# Patient Record
Sex: Female | Born: 2000 | Hispanic: Yes | Marital: Single | Smoking: Never smoker
Health system: Southern US, Community
[De-identification: ages and names within clinical notes are randomized; demographics above are authoritative.]

## PROBLEM LIST (undated history)

## (undated) DIAGNOSIS — F32A Depression, unspecified: Secondary | ICD-10-CM

## (undated) DIAGNOSIS — F4310 Post-traumatic stress disorder, unspecified: Secondary | ICD-10-CM

## (undated) DIAGNOSIS — R7303 Prediabetes: Secondary | ICD-10-CM

## (undated) DIAGNOSIS — F419 Anxiety disorder, unspecified: Secondary | ICD-10-CM

---

## 2021-03-25 ENCOUNTER — Encounter (HOSPITAL_BASED_OUTPATIENT_CLINIC_OR_DEPARTMENT_OTHER): Payer: Self-pay

## 2021-03-25 ENCOUNTER — Emergency Department (HOSPITAL_BASED_OUTPATIENT_CLINIC_OR_DEPARTMENT_OTHER)
Admission: EM | Admit: 2021-03-25 | Discharge: 2021-03-26 | Disposition: A | Discharge status: Home / Self Care | Payer: No Typology Code available for payment source | Attending: Emergency Medicine | Admitting: Emergency Medicine

## 2021-03-25 ENCOUNTER — Emergency Department (HOSPITAL_BASED_OUTPATIENT_CLINIC_OR_DEPARTMENT_OTHER): Payer: No Typology Code available for payment source

## 2021-03-25 ENCOUNTER — Other Ambulatory Visit: Payer: Self-pay

## 2021-03-25 DIAGNOSIS — M79602 Pain in left arm: Secondary | ICD-10-CM | POA: Insufficient documentation

## 2021-03-25 DIAGNOSIS — W228XXA Striking against or struck by other objects, initial encounter: Secondary | ICD-10-CM | POA: Insufficient documentation

## 2021-03-25 DIAGNOSIS — Y990 Civilian activity done for income or pay: Secondary | ICD-10-CM | POA: Insufficient documentation

## 2021-03-25 HISTORY — DX: Prediabetes: R73.03

## 2021-03-25 HISTORY — DX: Depression, unspecified: F32.A

## 2021-03-25 HISTORY — DX: Post-traumatic stress disorder, unspecified: F43.10

## 2021-03-25 HISTORY — DX: Anxiety disorder, unspecified: F41.9

## 2021-03-25 NOTE — ED Triage Notes (Signed)
Pt reports she was at work tonight when she was hit on the LEFT arm with a large bin as someone else was lifting it up. She reports pain to entire left arm, shoulder down to fingertips. No visible wounds noted. + radial pulse. She is able to move her arm but states it is painful to do so.

## 2021-03-26 MED ORDER — IBUPROFEN 600 MG PO TABS: 600.0000 mg | ORAL_TABLET | Freq: Four times a day (QID) | ORAL | 0 refills | Status: AC | PRN

## 2021-03-26 MED ORDER — OXYCODONE-ACETAMINOPHEN 5-325 MG PO TABS
1.0000 | ORAL_TABLET | Freq: Once | ORAL | Status: DC
Start: 2021-03-26 — End: 2021-03-26
  Filled 2021-03-26: qty 1

## 2021-03-26 MED ORDER — IBUPROFEN 800 MG PO TABS
800.0000 mg | ORAL_TABLET | Freq: Once | ORAL | Status: AC
  Administered 2021-03-26: 800 mg via ORAL
  Filled 2021-03-26: qty 1

## 2021-03-26 NOTE — ED Provider Notes (Signed)
[facility] EMERGENCY DEPARTMENT Provider Note   CSN: [id number] Arrival date & time: 03/25/21  2329     History Chief Complaint  Patient presents with   Arm Injury    Tara Sparks is a 20 y.o. female.  HPI     This a 20 year old female who presents with left arm pain.  Patient reports that she was at work when a coworker hit her left elbow with a heavy bin.  She reports pain immediately radiated up and down her arm.  She describes it as sharp.  It is 10 out of 10.  Warmth seems to help the pain.  Denies numbness or tingling in the hand.  She is right-handed.  Past Medical History:  Diagnosis Date   Anxiety    Depression    Prediabetes    PTSD (post-traumatic stress disorder)     There are no problems to display for this patient.   History reviewed. No pertinent surgical history.   OB History   No obstetric history on file.     No family history on file.  Social History   Tobacco Use   Smoking status: Never    Passive exposure: Never   Smokeless tobacco: Never  Substance Use Topics   Alcohol use: Yes   Drug use: Not Currently    Home Medications Prior to Admission medications   Medication Sig Start Date End Date Taking? Authorizing Provider  ibuprofen (ADVIL) 600 MG tablet Take 1 tablet (600 mg total) by mouth every 6 (six) hours as needed. 03/26/21  Yes Nioka Thorington, Mayer Masker, MD    Allergies    Patient has no known allergies.  Review of Systems   Review of Systems  Musculoskeletal:        Left arm pain  Neurological:  Negative for weakness and numbness.  All other systems reviewed and are negative.  Physical Exam Updated Vital Signs BP 135/87 (BP Location: Left Arm)   Pulse 79   Temp 99.2 F (37.3 C) (Oral)   Resp 16   Ht 1.473 m (4\' 10" )   Wt 65.3 kg   LMP 02/28/2021 (Exact Date)   SpO2 99%   BMI 30.10 kg/m   Physical Exam Vitals and nursing note reviewed.  Constitutional:      Appearance: She is well-developed.  She is not ill-appearing.  HENT:     Head: Normocephalic and atraumatic.     Mouth/Throat:     Mouth: Mucous membranes are moist.  Eyes:     Pupils: Pupils are equal, round, and reactive to light.  Cardiovascular:     Rate and Rhythm: Normal rate and regular rhythm.  Pulmonary:     Effort: Pulmonary effort is normal. No respiratory distress.  Abdominal:     Palpations: Abdomen is soft.  Musculoskeletal:     Cervical back: Normal range of motion and neck supple. No tenderness.     Comments: Quite limited exam secondary to patient's reported pain, she holds her arm in an adducted position.  She has pain with abduction flexion and extension of the elbow, no obvious deformities, no soft tissue swelling, neurovascular intact distally with a 2+ radial pulse, left shoulder appears to be in place and not dislocated  Skin:    General: Skin is warm and dry.  Neurological:     Mental Status: She is alert and oriented to person, place, and time.  Psychiatric:        Mood and Affect: Mood normal.  ED Results / Procedures / Treatments   Labs (all labs ordered are listed, but only abnormal results are displayed) Labs Reviewed - No data to display  EKG None  Radiology DG Wrist Complete Left  Result Date: 03/26/2021 CLINICAL DATA:  Left wrist injury, pain EXAM: LEFT WRIST - COMPLETE 3+ VIEW COMPARISON:  None. FINDINGS: There is no evidence of fracture or dislocation. There is no evidence of arthropathy or other focal bone abnormality. Soft tissues are unremarkable. IMPRESSION: Negative. Electronically Signed   By: Helyn Numbers M.D.   On: 03/26/2021 00:17   DG Shoulder Left  Result Date: 03/26/2021 CLINICAL DATA:  Left shoulder injury, pain EXAM: LEFT SHOULDER - 2+ VIEW COMPARISON:  None. FINDINGS: There is no evidence of fracture or dislocation. There is no evidence of arthropathy or other focal bone abnormality. Soft tissues are unremarkable. IMPRESSION: Negative. Electronically Signed    By: Helyn Numbers M.D.   On: 03/26/2021 00:16   DG Humerus Left  Result Date: 03/26/2021 CLINICAL DATA:  Left arm injury, pain EXAM: LEFT HUMERUS - 2+ VIEW COMPARISON:  None. FINDINGS: There is no evidence of fracture or other focal bone lesions. Soft tissues are unremarkable. IMPRESSION: Negative. Electronically Signed   By: Helyn Numbers M.D.   On: 03/26/2021 00:16   DG Hand Complete Left  Result Date: 03/26/2021 CLINICAL DATA:  Left hand injury, pain EXAM: LEFT HAND - COMPLETE 3+ VIEW COMPARISON:  None. FINDINGS: There is no evidence of fracture or dislocation. There is no evidence of arthropathy or other focal bone abnormality. Soft tissues are unremarkable. IMPRESSION: Negative. Electronically Signed   By: Helyn Numbers M.D.   On: 03/26/2021 00:16    Procedures Procedures   Medications Ordered in ED Medications  ibuprofen (ADVIL) tablet 800 mg (800 mg Oral Given 03/26/21 0022)    ED Course  I have reviewed the triage vital signs and the nursing notes.  Pertinent labs & imaging results that were available during my care of the patient were reviewed by me and considered in my medical decision making (see chart for details).    MDM Rules/Calculators/A&P                           Patient presents with left arm pain.  She is overall nontoxic and vital signs are reassuring.  She describes stingers both up and down the arm after her arm was directly hit with a box at the elbow.  Her exam is fairly limited secondary to reported pain although pain appears out of proportion to physical exam findings.  There is no obvious deformities to suggest fracture.  Given radiation of pain, question stinger from nerve injury.  X-rays obtained and reviewed.  X-rays of the shoulder, arm, elbow, and forearm are all negative.  Low suspicion for occult fracture given mechanism.  Recommend anti-inflammatories and ice.  After history, exam, and medical workup I feel the patient has been appropriately  medically screened and is safe for discharge home. Pertinent diagnoses were discussed with the patient. Patient was given return precautions.  Final Clinical Impression(s) / ED Diagnoses Final diagnoses:  Left arm pain    Rx / DC Orders ED Discharge Orders          Ordered    ibuprofen (ADVIL) 600 MG tablet  Every 6 hours PRN        03/26/21 0025             Frederic Tones, Toni Amend  F, MD 03/26/21 0102

## 2021-03-26 NOTE — Discharge Instructions (Addendum)
You were seen today for left arm pain.  Your x-rays do not show any evidence of fracture.  Given that the pain radiates from the left elbow where you got hit, you could have some nerve pain.  Take ibuprofen and ice the area.

## 2022-12-20 IMAGING — DX DG WRIST COMPLETE 3+V*L*
4 series · 4 of 4 positions shown · non-contrast
Comparison: None.

CLINICAL DATA: Left wrist injury, pain

EXAM:
LEFT WRIST - COMPLETE 3+ VIEW

[wrist pa]
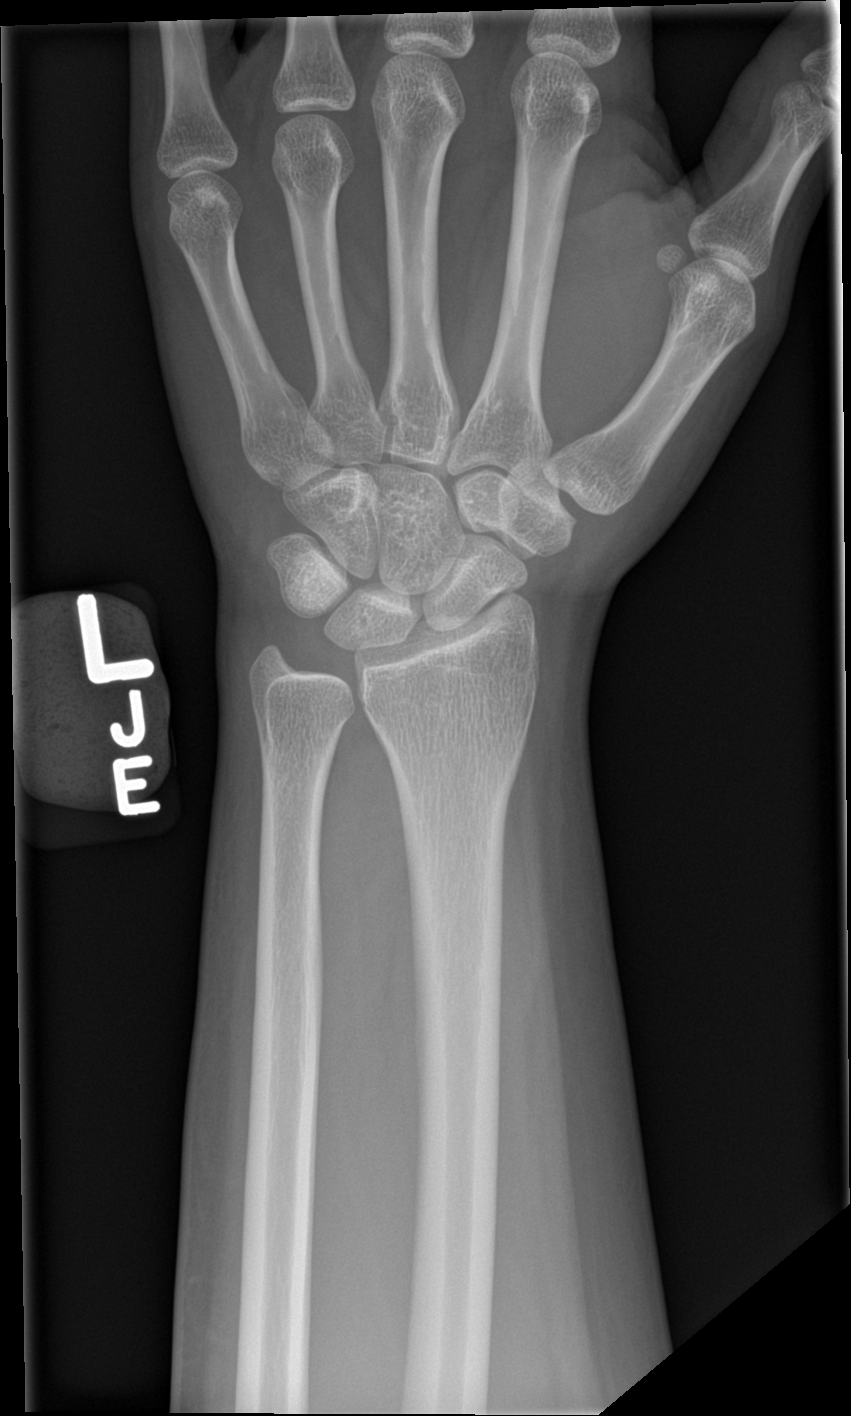

[wrist obl]
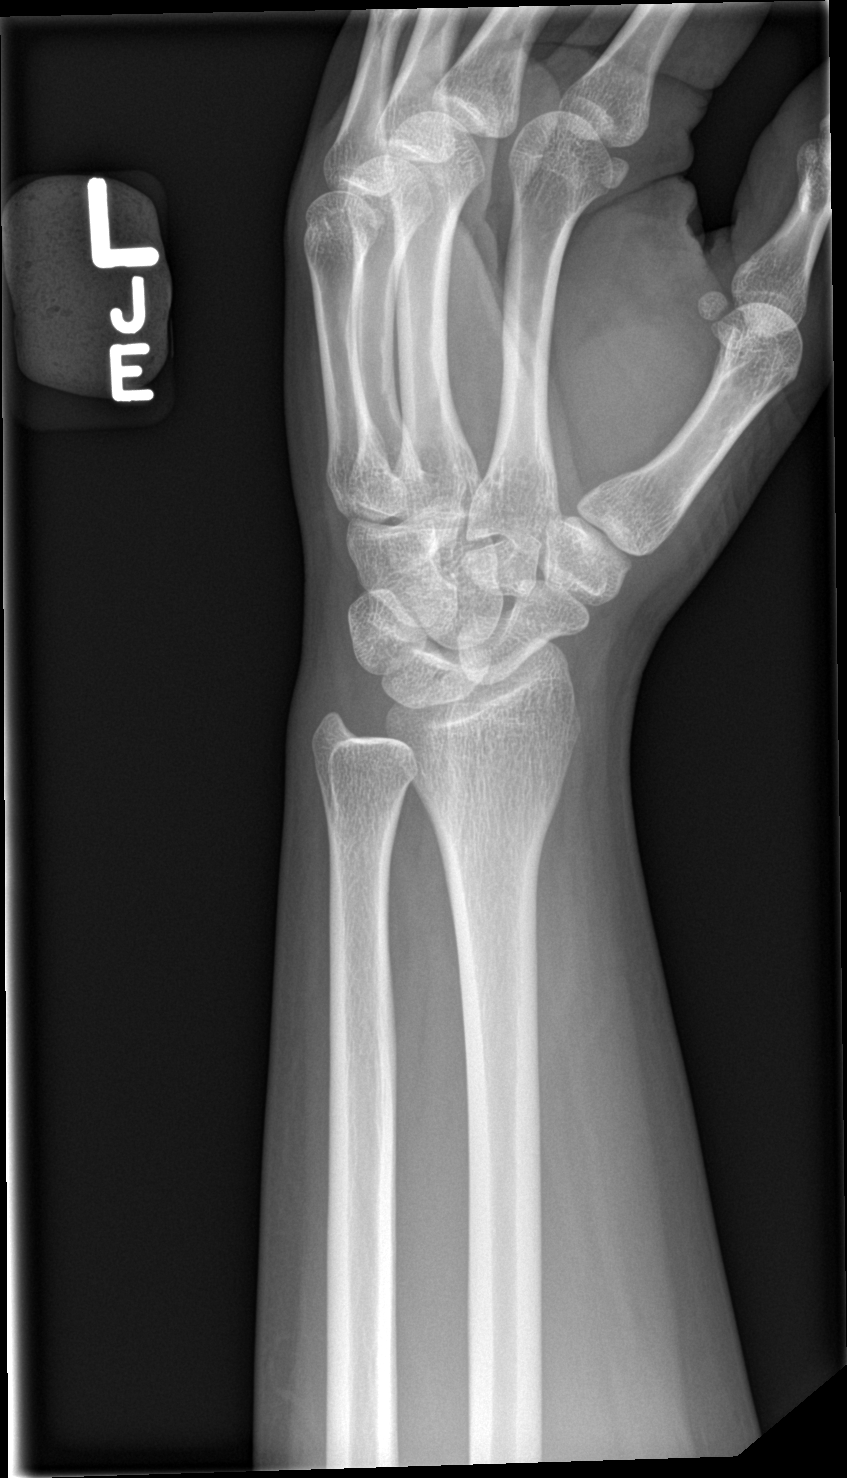

[wrist lat]
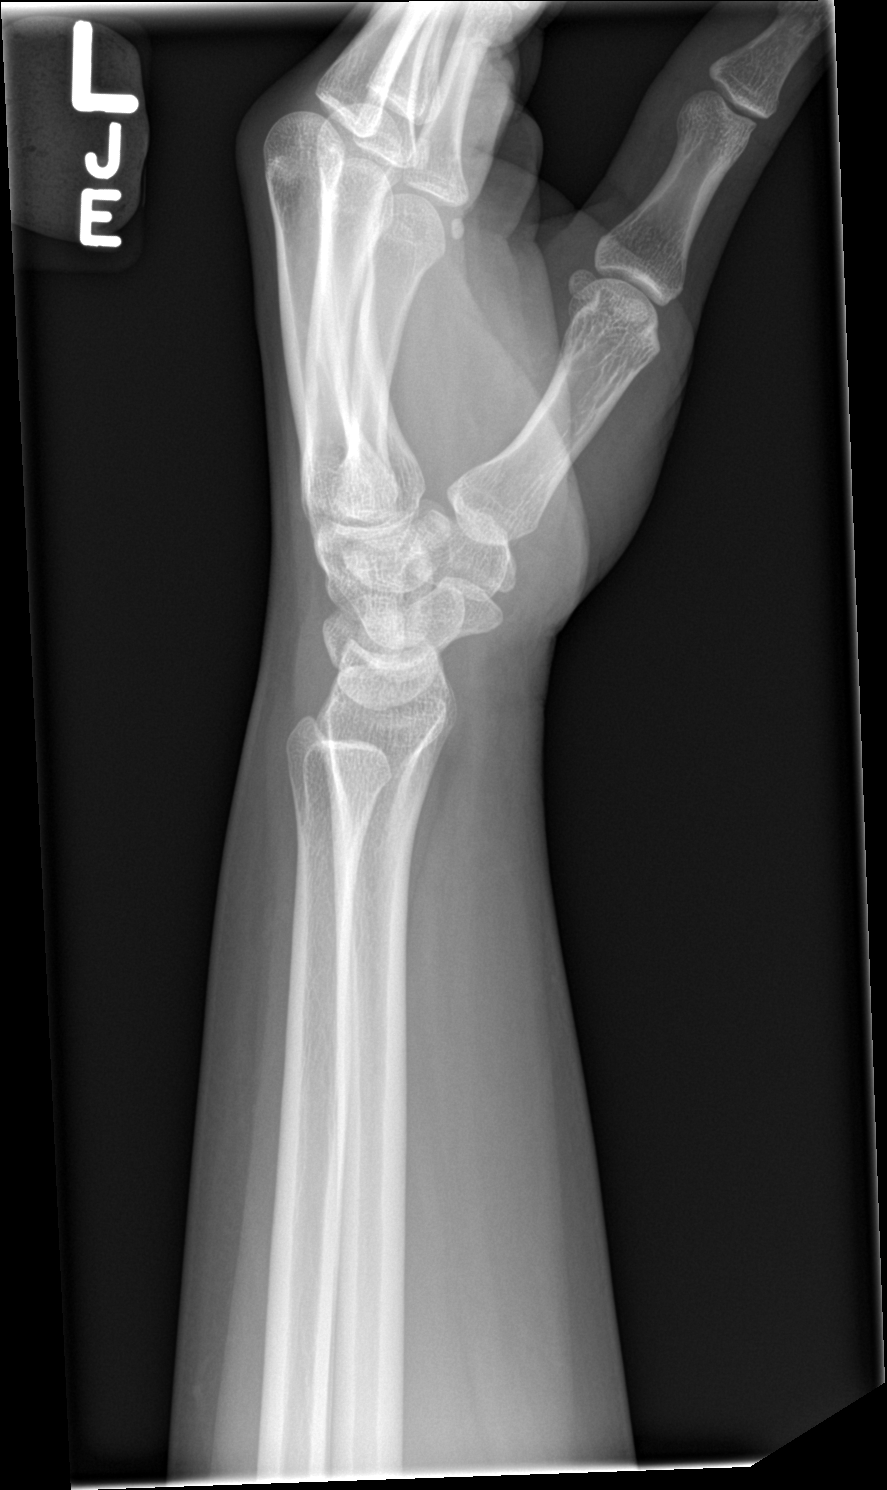

[wrist navicular]
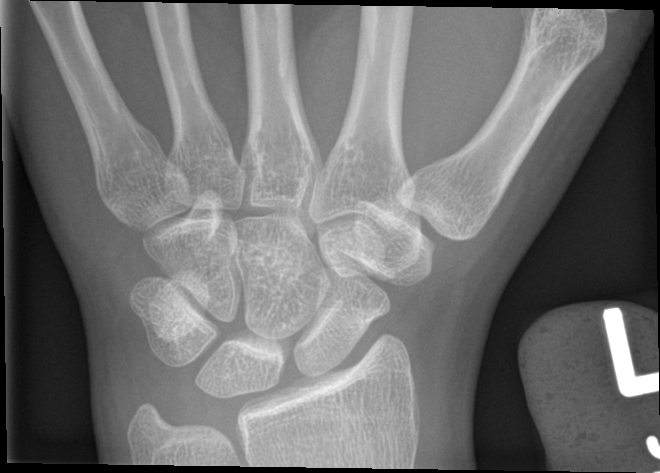

[4 of 4 positions shown; findings below may reference images not displayed]

FINDINGS: There is no evidence of fracture or dislocation. There is no
evidence of arthropathy or other focal bone abnormality. Soft
tissues are unremarkable.
IMPRESSION: Negative.
